# Patient Record
Sex: Female | Born: 1965 | Race: White | Hispanic: No | Marital: Married | Smoking: Former smoker
Health system: Southern US, Community
[De-identification: ages and names within clinical notes are randomized; demographics above are authoritative.]

## PROBLEM LIST (undated history)

## (undated) DIAGNOSIS — F419 Anxiety disorder, unspecified: Secondary | ICD-10-CM

## (undated) DIAGNOSIS — K56609 Unspecified intestinal obstruction, unspecified as to partial versus complete obstruction: Secondary | ICD-10-CM

## (undated) DIAGNOSIS — I10 Essential (primary) hypertension: Secondary | ICD-10-CM

## (undated) HISTORY — DX: Anxiety disorder, unspecified: F41.9

## (undated) HISTORY — DX: Essential (primary) hypertension: I10

---

## 1988-04-16 HISTORY — PX: BREAST REDUCTION SURGERY: SHX8

## 2017-07-30 ENCOUNTER — Encounter: Payer: Self-pay | Admitting: Cardiology

## 2017-08-12 ENCOUNTER — Encounter: Payer: Self-pay | Admitting: Cardiology

## 2017-08-20 ENCOUNTER — Encounter: Payer: Self-pay | Admitting: Cardiology

## 2017-08-20 NOTE — Progress Notes (Signed)
Cardiology Office Note  Date: 08/22/2017   ID: Claudia Brewer, DOB 04/24/1965, MRN [id number]  PCP: Ignatius Specking, MD  Primary Cardiologist: Nona Dell, MD   Chief Complaint  Patient presents with  . Chest Pain    History of Present Illness: Claudia Brewer is a 52 y.o. female referred for cardiology consultation by Ms. Hairfield NP for the evaluation of chest pain.  She states that she has been under a lot of emotional stress, has also gained 10 to 20 pounds over the last 6 months, not exercising as regularly.  She has had intermittent episodes of chest tightness, cannot specifically identify any particular activity that brings it on.  She is having trouble sleeping related to her stress as well.  Family history includes heart disease in her father as well as paternal grandfather.  She also has had elevated blood pressure and blood sugar recently, currently not on medical therapy.  I personally reviewed her ECG from 08/02/2017 which shows normal sinus rhythm with decreased R wave progression.  Recent echocardiogram done at [facility] in April is outlined below.  LVEF was vigorous at 70 to 75%.  There were no major valvular abnormalities to explain symptomatology.  Today we talked about additional testing for assessment of ischemic heart disease.  Past Medical History:  Diagnosis Date  . Anxiety   . Essential hypertension     Past Surgical History:  Procedure Laterality Date  . BREAST REDUCTION SURGERY  1990    Current Outpatient Medications  Medication Sig Dispense Refill  . ALPRAZolam (XANAX) 0.5 MG tablet Take 1 tablet by mouth at bedtime as needed.    Marland Kitchen buPROPion (WELLBUTRIN SR) 150 MG 12 hr tablet Take 150 mg by mouth 2 (two) times daily.     No current facility-administered medications for this visit.    Allergies:  Patient has no known allergies.   Social History: The patient  reports that she has quit smoking. Her smoking use included  cigarettes. She has never used smokeless tobacco. She reports that she drinks alcohol. She reports that she does not use drugs.   Family History: The patient's family history includes CAD in her father; Lung cancer in her father.   ROS:  Please see the history of present illness. Otherwise, complete review of systems is positive for none.  All other systems are reviewed and negative.   Physical Exam: VS:  BP (!) 151/81 (BP Location: Right Arm, Cuff Size: Large)   Pulse 81   Ht  (1.651 m)   Wt 208 lb (94.3 kg)   SpO2 97%   BMI 34.61 kg/m , BMI Body mass index is 34.61 kg/m.  Wt Readings from Last 3 Encounters:  08/22/17 208 lb (94.3 kg)    General: Patient appears comfortable at rest. HEENT: Conjunctiva and lids normal, oropharynx clear. Neck: Supple, no elevated JVP or carotid bruits, no thyromegaly. Lungs: Clear to auscultation, nonlabored breathing at rest. Cardiac: Regular rate and rhythm, no S3 or significant systolic murmur, no pericardial rub. Abdomen: Soft, nontender, bowel sounds present. Extremities: No pitting edema, distal pulses 2+. Skin: Warm and dry. Musculoskeletal: No kyphosis. Neuropsychiatric: Alert and oriented x3, affect grossly appropriate.  ECG: There is no old tracing available for comparison today.  Other Studies Reviewed Today:  Echocardiogram 08/12/2017 [facility]): Mild LVH with LVEF 70 to 75%, ungraded diastolic dysfunction, normal right ventricular contraction, moderate left atrial enlargement, mildly sclerotic aortic valve without stenosis, mildly calcified mitral leaflets with  trace mitral regurgitation, trace tricuspid regurgitation with RVSP estimated 16 mmHg, no pericardial effusion.  Assessment and Plan:  1.  Intermittent chest tightness, noted in the setting of emotional stress and weight gain over the last 6 months.  Cardiac risk factors include family history of CAD, recently elevated blood pressures and also blood sugars.   Baseline ECG shows sinus rhythm with poor R wave progression.  LVEF is vigorous at 70 to 75% by echocardiogram without wall motion abnormalities.  Plan is to obtain an exercise Myoview for objective evaluation of ischemic heart disease.  2.  Elevated blood pressure.  Currently not on antihypertensive medications.  We discussed weight loss, she needs to follow-up with PCP regarding possibility of medical therapy.  3.  Reported elevated blood sugars.  Discussed diet and weight loss.  Follow-up with PCP.  Current medicines were reviewed with the patient today.   Orders Placed This Encounter  Procedures  . NM Myocar Multi W/Spect W/Wall Motion / EF    Disposition: Call with test results.  Signed, Jonelle Sidle, MD, [facility] 08/22/2017 1:46 PM    [facility] at [facility] [address] [place], [place], [place] [address] Phone: [phone]; Fax: [phone]

## 2017-08-22 ENCOUNTER — Ambulatory Visit: Payer: BC Managed Care – PPO | Admitting: Cardiology

## 2017-08-22 ENCOUNTER — Encounter: Payer: Self-pay | Admitting: Cardiology

## 2017-08-22 ENCOUNTER — Telehealth: Payer: Self-pay | Admitting: Cardiology

## 2017-08-22 VITALS — BP 151/81 | HR 81 | Ht 65.0 in | Wt 208.0 lb

## 2017-08-22 DIAGNOSIS — R457 State of emotional shock and stress, unspecified: Secondary | ICD-10-CM | POA: Diagnosis not present

## 2017-08-22 DIAGNOSIS — Z8249 Family history of ischemic heart disease and other diseases of the circulatory system: Secondary | ICD-10-CM | POA: Diagnosis not present

## 2017-08-22 DIAGNOSIS — R7309 Other abnormal glucose: Secondary | ICD-10-CM | POA: Diagnosis not present

## 2017-08-22 DIAGNOSIS — R030 Elevated blood-pressure reading, without diagnosis of hypertension: Secondary | ICD-10-CM

## 2017-08-22 DIAGNOSIS — R9431 Abnormal electrocardiogram [ECG] [EKG]: Secondary | ICD-10-CM | POA: Diagnosis not present

## 2017-08-22 DIAGNOSIS — R0789 Other chest pain: Secondary | ICD-10-CM | POA: Diagnosis not present

## 2017-08-22 DIAGNOSIS — R072 Precordial pain: Secondary | ICD-10-CM

## 2017-08-22 NOTE — Patient Instructions (Signed)
Medication Instructions:  Your physician recommends that you continue on your current medications as directed. Please refer to the Current Medication list given to you today.  Labwork: NONE  Testing/Procedures: Your physician has requested that you have en exercise stress myoview. For further information please visit [email or web address]. Please follow instruction sheet, as given.  Follow-Up: Your physician recommends that you schedule a follow-up appointment PENDING TEST RESULTS  Any Other Special Instructions Will Be Listed Below (If Applicable).  If you need a refill on your cardiac medications before your next appointment, please call your pharmacy. 

## 2017-08-22 NOTE — Telephone Encounter (Signed)
Pre-cert Verification for the following procedure   Exercise myoview scheduled for 08/29/17 at [facility]

## 2017-08-29 ENCOUNTER — Encounter (HOSPITAL_COMMUNITY)
Admission: RE | Admit: 2017-08-29 | Discharge: 2017-08-29 | Disposition: A | Discharge status: Home / Self Care | Payer: BC Managed Care – PPO | Source: Ambulatory Visit | Attending: Cardiology | Admitting: Cardiology

## 2017-08-29 ENCOUNTER — Ambulatory Visit (HOSPITAL_COMMUNITY)
Admission: RE | Admit: 2017-08-29 | Discharge: 2017-08-29 | Disposition: A | Discharge status: Home / Self Care | Payer: BC Managed Care – PPO | Source: Ambulatory Visit | Attending: Cardiology | Admitting: Cardiology

## 2017-08-29 ENCOUNTER — Encounter (HOSPITAL_COMMUNITY): Payer: Self-pay

## 2017-08-29 DIAGNOSIS — R0789 Other chest pain: Secondary | ICD-10-CM

## 2017-08-29 DIAGNOSIS — R9431 Abnormal electrocardiogram [ECG] [EKG]: Secondary | ICD-10-CM

## 2017-08-29 LAB — NM MYOCAR MULTI W/SPECT W/WALL MOTION / EF
CHL CUP NUCLEAR SSS: 0
CSEPED: 8 min
Estimated workload: 10.1 METS
Exercise duration (sec): 20 s
LV sys vol: 22 mL
LVDIAVOL: 59 mL (ref 46–106)
MPHR: 169 {beats}/min
NUC STRESS TID: 0.78
Peak HR: 153 {beats}/min
Percent HR: 90 %
RATE: 0.46
RPE: 17
Rest HR: 71 {beats}/min
SDS: 0
SRS: 0

## 2017-08-29 MED ORDER — TECHNETIUM TC 99M TETROFOSMIN IV KIT
10.0000 | PACK | Freq: Once | INTRAVENOUS | Status: AC | PRN
  Administered 2017-08-29: 9.1 via INTRAVENOUS

## 2017-08-29 MED ORDER — SODIUM CHLORIDE 0.9% FLUSH
INTRAVENOUS | Status: AC
  Filled 2017-08-29: qty 180

## 2017-08-29 MED ORDER — SODIUM CHLORIDE 0.9% FLUSH
INTRAVENOUS | Status: AC
  Administered 2017-08-29: 10 mL via INTRAVENOUS
  Filled 2017-08-29: qty 10

## 2017-08-29 MED ORDER — REGADENOSON 0.4 MG/5ML IV SOLN
INTRAVENOUS | Status: AC
  Filled 2017-08-29: qty 5

## 2017-08-29 MED ORDER — TECHNETIUM TC 99M TETROFOSMIN IV KIT
30.0000 | PACK | Freq: Once | INTRAVENOUS | Status: AC | PRN
  Administered 2017-08-29: 30 via INTRAVENOUS

## 2017-09-02 ENCOUNTER — Telehealth: Payer: Self-pay | Admitting: *Deleted

## 2017-09-02 NOTE — Telephone Encounter (Signed)
-----   Message from Jonelle Sidle, MD sent at 09/01/2017  2:21 PM EDT ----- Results reviewed. Please let her know that the stress test was overall low risk from a cardiac perspective (no ischemic defects to suggest obstructive CAD). Would focus on weight loss, stress reduction, and follow-up on blood pressure with PCP. No further cardiac testing is planned at this time. A copy of this test should be forwarded to Ignatius Specking, MD.

## 2017-09-02 NOTE — Telephone Encounter (Signed)
Patient informed and copy sent to PCP. 

## 2018-11-05 ENCOUNTER — Encounter (INDEPENDENT_AMBULATORY_CARE_PROVIDER_SITE_OTHER): Payer: Self-pay | Admitting: *Deleted

## 2019-06-25 ENCOUNTER — Encounter (INDEPENDENT_AMBULATORY_CARE_PROVIDER_SITE_OTHER): Payer: Self-pay | Admitting: Gastroenterology

## 2019-07-11 ENCOUNTER — Emergency Department (HOSPITAL_COMMUNITY): Payer: BC Managed Care – PPO

## 2019-07-11 ENCOUNTER — Encounter (HOSPITAL_COMMUNITY): Payer: Self-pay

## 2019-07-11 ENCOUNTER — Emergency Department (HOSPITAL_COMMUNITY)
Admission: EM | Admit: 2019-07-11 | Discharge: 2019-07-11 | Disposition: A | Discharge status: Home / Self Care | Payer: BC Managed Care – PPO | Attending: Emergency Medicine | Admitting: Emergency Medicine

## 2019-07-11 ENCOUNTER — Other Ambulatory Visit: Payer: Self-pay

## 2019-07-11 DIAGNOSIS — I10 Essential (primary) hypertension: Secondary | ICD-10-CM | POA: Diagnosis not present

## 2019-07-11 DIAGNOSIS — N200 Calculus of kidney: Secondary | ICD-10-CM | POA: Diagnosis not present

## 2019-07-11 DIAGNOSIS — Z87891 Personal history of nicotine dependence: Secondary | ICD-10-CM | POA: Diagnosis not present

## 2019-07-11 DIAGNOSIS — R109 Unspecified abdominal pain: Secondary | ICD-10-CM | POA: Diagnosis present

## 2019-07-11 DIAGNOSIS — Z79899 Other long term (current) drug therapy: Secondary | ICD-10-CM | POA: Insufficient documentation

## 2019-07-11 DIAGNOSIS — E119 Type 2 diabetes mellitus without complications: Secondary | ICD-10-CM | POA: Diagnosis not present

## 2019-07-11 DIAGNOSIS — Z7984 Long term (current) use of oral hypoglycemic drugs: Secondary | ICD-10-CM | POA: Diagnosis not present

## 2019-07-11 DIAGNOSIS — K5904 Chronic idiopathic constipation: Secondary | ICD-10-CM | POA: Diagnosis not present

## 2019-07-11 HISTORY — DX: Unspecified intestinal obstruction, unspecified as to partial versus complete obstruction: K56.609

## 2019-07-11 LAB — COMPREHENSIVE METABOLIC PANEL
ALT: 20 U/L (ref 0–44)
AST: 25 U/L (ref 15–41)
Albumin: 4.3 g/dL (ref 3.5–5.0)
Alkaline Phosphatase: 60 U/L (ref 38–126)
Anion gap: 9 (ref 5–15)
BUN: 16 mg/dL (ref 6–20)
CO2: 27 mmol/L (ref 22–32)
Calcium: 9.2 mg/dL (ref 8.9–10.3)
Chloride: 100 mmol/L (ref 98–111)
Creatinine, Ser: 1.48 mg/dL — ABNORMAL HIGH (ref 0.44–1.00)
GFR calc Af Amer: 46 mL/min — ABNORMAL LOW (ref >60)
GFR calc non Af Amer: 40 mL/min — ABNORMAL LOW (ref >60)
Glucose, Bld: 192 mg/dL — ABNORMAL HIGH (ref 70–99)
Potassium: 4.1 mmol/L (ref 3.5–5.1)
Sodium: 136 mmol/L (ref 135–145)
Total Bilirubin: 0.8 mg/dL (ref 0.3–1.2)
Total Protein: 7.3 g/dL (ref 6.5–8.1)

## 2019-07-11 LAB — POC URINE PREG, ED: Preg Test, Ur: NEGATIVE

## 2019-07-11 LAB — CBC
HCT: 44.4 % (ref 36.0–46.0)
Hemoglobin: 14.4 g/dL (ref 12.0–15.0)
MCH: 28.5 pg (ref 26.0–34.0)
MCHC: 32.4 g/dL (ref 30.0–36.0)
MCV: 87.7 fL (ref 80.0–100.0)
Platelets: 288 10*3/uL (ref 150–400)
RBC: 5.06 MIL/uL (ref 3.87–5.11)
RDW: 12.1 % (ref 11.5–15.5)
WBC: 9.8 10*3/uL (ref 4.0–10.5)
nRBC: 0 % (ref 0.0–0.2)

## 2019-07-11 LAB — LIPASE, BLOOD: Lipase: 24 U/L (ref 11–51)

## 2019-07-11 MED ORDER — TAMSULOSIN HCL 0.4 MG PO CAPS: 0.4000 mg | ORAL_CAPSULE | Freq: Every day | ORAL | 0 refills | Status: AC

## 2019-07-11 MED ORDER — IOHEXOL 300 MG/ML  SOLN
80.0000 mL | Freq: Once | INTRAMUSCULAR | Status: AC | PRN
  Administered 2019-07-11: 80 mL via INTRAVENOUS

## 2019-07-11 MED ORDER — KETOROLAC TROMETHAMINE 60 MG/2ML IM SOLN
60.0000 mg | Freq: Once | INTRAMUSCULAR | Status: AC
  Administered 2019-07-11: 60 mg via INTRAMUSCULAR
  Filled 2019-07-11: qty 2

## 2019-07-11 MED ORDER — HYDROCODONE-ACETAMINOPHEN 5-325 MG PO TABS: 1.0000 | ORAL_TABLET | Freq: Four times a day (QID) | ORAL | 0 refills | Status: DC | PRN

## 2019-07-11 NOTE — ED Triage Notes (Signed)
Pt c/o r lower abd pain since yesterday and vomiting this morning.  Reports no BM in 4 days.  Pt says had a bowel obstruction 2 months ago.

## 2019-07-11 NOTE — ED Provider Notes (Signed)
[facility] EMERGENCY DEPARTMENT Provider Note   CSN: [id number] Arrival date & time: 07/11/19  1103     History Chief Complaint  Patient presents with  . Abdominal Pain    Claudia Brewer is a 54 y.o. female.  HPI 54 year old well-appearing female with a history of DM type II on Metformin and bowel obstruction presents to the ER with right lower abdominal pain x4 days.  Last bowel movement was approximately 4 days ago.  States she had a similar episode approximately 2 months ago, she went to the ER at [facility] where they did an x-ray and was told she was constipated.  She was discharged and was told to take over-the-counter stool softeners.  Reports taking MiraLAX, magnesium citrate, and bowels began moving approximately 2 to 3 days after but bowel movements have remained slow. She reports she has been having increased diffuse right-sided abdominal x4 days, the pain has gotten so severe that she took a Tramadol this morning which has mildly alleviated her pain.  She had one episode of nonbilious nonbloody vomiting this morning.  She has tried taking MiraLAX without relief.  Denies dizziness, chest pain, fevers, dysuria, hematuria, flank pain, syncope, hematochezia.     Past Medical History:  Diagnosis Date  . Anxiety   . Bowel obstruction (HCC)   . Essential hypertension     There are no problems to display for this patient.   Past Surgical History:  Procedure Laterality Date  . BREAST REDUCTION SURGERY  1990     OB History   No obstetric history on file.     Family History  Problem Relation Age of Onset  . CAD Father   . Lung cancer Father     Social History   Tobacco Use  . Smoking status: Former Smoker    Types: Cigarettes  . Smokeless tobacco: Never Used  Substance Use Topics  . Alcohol use: Not Currently    Comment: Occasional  . Drug use: Never    Home Medications Prior to Admission medications   Medication Sig Start Date End Date Taking?  Authorizing Provider  ALPRAZolam Prudy Feeler) 0.5 MG tablet Take 1 tablet by mouth at bedtime as needed. 07/30/17   [provider]  amLODipine (NORVASC) 5 MG tablet Take 5 mg by mouth daily. 06/10/19   [provider]  buPROPion (WELLBUTRIN SR) 150 MG 12 hr tablet Take 150 mg by mouth 2 (two) times daily.    [provider]  dicyclomine (BENTYL) 10 MG capsule Take 10 mg by mouth 2 (two) times daily as needed. 05/04/19   [provider]  hydrochlorothiazide (HYDRODIURIL) 12.5 MG tablet Take 12.5 mg by mouth daily. 04/06/19   [provider]  HYDROcodone-acetaminophen (NORCO/VICODIN) 5-325 MG tablet Take 1 tablet by mouth every 6 (six) hours as needed. 07/11/19   Mare Ferrari, PA-C  metFORMIN (GLUCOPHAGE) 500 MG tablet Take 500 mg by mouth 3 (three) times daily. 05/20/19   [provider]  rosuvastatin (CRESTOR) 20 MG tablet Take 20 mg by mouth at bedtime. 06/10/19   [provider]  tamsulosin (FLOMAX) 0.4 MG CAPS capsule Take 1 capsule (0.4 mg total) by mouth daily for 5 days. 07/11/19 07/16/19  Mare Ferrari, PA-C  traMADol (ULTRAM) 50 MG tablet Take 50 mg by mouth 3 (three) times daily as needed. 05/04/19   [provider]    Allergies    Patient has no known allergies.  Review of Systems   Review of Systems  Constitutional: Negative for appetite change, chills and fever.  Respiratory: Negative for shortness of breath.   Gastrointestinal: Positive for abdominal pain, constipation, nausea and vomiting. Negative for diarrhea and rectal pain.  Endocrine: Negative for polydipsia, polyphagia and polyuria.  Genitourinary: Negative for decreased urine volume, difficulty urinating, dysuria, flank pain, frequency, hematuria, pelvic pain and urgency.  Musculoskeletal: Negative for back pain and myalgias.  Skin: Negative for color change and rash.  Neurological: Negative for dizziness and headaches.    Physical Exam Updated Vital  Signs BP (!) 144/74   Pulse 82   Temp 98.2 F (36.8 C) (Oral)   Resp 18   Ht 5\' 5"  (1.651 m)   Wt 83 kg   SpO2 95%   BMI 30.45 kg/m   Physical Exam Vitals and nursing note reviewed.  Constitutional:      Appearance: Normal appearance.  HENT:     Head: Normocephalic and atraumatic.  Eyes:     Extraocular Movements: Extraocular movements intact.     Conjunctiva/sclera: Conjunctivae normal.     Pupils: Pupils are equal, round, and reactive to light.  Cardiovascular:     Rate and Rhythm: Normal rate and regular rhythm.     Pulses: Normal pulses.     Heart sounds: Normal heart sounds.  Pulmonary:     Effort: Pulmonary effort is normal.     Breath sounds: Normal breath sounds.  Abdominal:     General: Abdomen is flat. Bowel sounds are normal. There are no signs of injury.     Palpations: Abdomen is soft. There is no mass.     Tenderness: There is abdominal tenderness in the right upper quadrant and right lower quadrant. There is no right CVA tenderness, left CVA tenderness, guarding or rebound. Negative signs include Murphy's sign, McBurney's sign and obturator sign.     Hernia: No hernia is present.  Musculoskeletal:        General: Normal range of motion.     Cervical back: Normal range of motion. No tenderness.  Skin:    General: Skin is warm and dry.  Neurological:     Mental Status: She is alert and oriented to person, place, and time.  Psychiatric:        Mood and Affect: Mood normal.        Behavior: Behavior normal.     ED Results / Procedures / Treatments   Labs (all labs ordered are listed, but only abnormal results are displayed) Labs Reviewed  COMPREHENSIVE METABOLIC PANEL - Abnormal; Notable for the following components:      Result Value   Glucose, Bld 192 (*)    Creatinine, Ser 1.48 (*)    GFR calc non Af Amer 40 (*)    GFR calc Af Amer 46 (*)    All other components within normal limits  LIPASE, BLOOD  CBC  URINALYSIS, ROUTINE W REFLEX MICROSCOPIC   POC URINE PREG, ED    EKG None  Radiology CT Abdomen Pelvis Wo Contrast  Result Date: 07/11/2019 CLINICAL DATA:  Right lower abdominal pain x1 day, vomiting, history of bowel obstruction EXAM: CT ABDOMEN AND PELVIS WITH CONTRAST TECHNIQUE: Multidetector CT imaging of the abdomen and pelvis was performed using the standard protocol following bolus administration of intravenous contrast. CONTRAST:  66mL OMNIPAQUE IOHEXOL 300 MG/ML  SOLN COMPARISON:  03/03/2018 and 06/29/2014 by report only FINDINGS: Lower chest: No acute abnormality. Hepatobiliary: No focal liver abnormality is seen. No gallstones, gallbladder wall thickening, or biliary dilatation. Pancreas: Unremarkable.  No pancreatic ductal dilatation or surrounding inflammatory changes. Spleen: Normal in size without focal abnormality. Adrenals/Urinary Tract: 2.1 cm left adrenal low-attenuation nodule, described on prior study of 06/29/2014. Right adrenal unremarkable. Moderate right hydronephrosis and ureterectasis down to the level of a 6 mm distal ureteral calculus, several cm from the UVJ. There is slightly decreased right renal enhancement compared to the left. The left kidney is unremarkable. The urinary bladder is incompletely distended. Stomach/Bowel: Stomach decompressed. Small bowel is nondistended. Normal appendix. The colon is nondilated, unremarkable. Vascular/Lymphatic: Minimal aortoiliac atherosclerosis (ICD10-170.0). No abdominal or pelvic adenopathy. Reproductive: IUD in place. No adnexal mass. Other: Bilateral pelvic phleboliths. No ascites. No free air. Musculoskeletal: Mild lower thoracic and lumbar spondylitic changes. No fracture or worrisome bone lesion. IMPRESSION: 1. Obstructing 6 mm distal right ureteral calculus with moderate hydronephrosis. 2. Stable left adrenal adenoma. Aortic Atherosclerosis (ICD10-I70.0). Electronically Signed   By: Corlis Leak M.D.   On: 07/11/2019 13:53   CT ABDOMEN PELVIS W CONTRAST  Result Date:  07/11/2019 CLINICAL DATA:  Right lower abdominal pain x1 day, vomiting, history of bowel obstruction EXAM: CT ABDOMEN AND PELVIS WITH CONTRAST TECHNIQUE: Multidetector CT imaging of the abdomen and pelvis was performed using the standard protocol following bolus administration of intravenous contrast. CONTRAST:  74mL OMNIPAQUE IOHEXOL 300 MG/ML  SOLN COMPARISON:  03/03/2018 and 06/29/2014 by report only FINDINGS: Lower chest: No acute abnormality. Hepatobiliary: No focal liver abnormality is seen. No gallstones, gallbladder wall thickening, or biliary dilatation. Pancreas: Unremarkable. No pancreatic ductal dilatation or surrounding inflammatory changes. Spleen: Normal in size without focal abnormality. Adrenals/Urinary Tract: 2.1 cm left adrenal low-attenuation nodule, described on prior study of 06/29/2014. Right adrenal unremarkable. Moderate right hydronephrosis and ureterectasis down to the level of a 6 mm distal ureteral calculus, several cm from the UVJ. There is slightly decreased right renal enhancement compared to the left. The left kidney is unremarkable. The urinary bladder is incompletely distended. Stomach/Bowel: Stomach decompressed. Small bowel is nondistended. Normal appendix. The colon is nondilated, unremarkable. Vascular/Lymphatic: Minimal aortoiliac atherosclerosis (ICD10-170.0). No abdominal or pelvic adenopathy. Reproductive: IUD in place. No adnexal mass. Other: Bilateral pelvic phleboliths. No ascites. No free air. Musculoskeletal: Mild lower thoracic and lumbar spondylitic changes. No fracture or worrisome bone lesion. IMPRESSION: 1. Obstructing 6 mm distal right ureteral calculus with moderate hydronephrosis. 2. Stable left adrenal adenoma. Aortic Atherosclerosis (ICD10-I70.0). Electronically Signed   By: Corlis Leak M.D.   On: 07/11/2019 13:53    Procedures Procedures (including critical care time)  Medications Ordered in ED Medications  iohexol (OMNIPAQUE) 300 MG/ML solution 80 mL  (80 mLs Intravenous Contrast Given 07/11/19 1321)  ketorolac (TORADOL) injection 60 mg (60 mg Intramuscular Given 07/11/19 1411)    ED Course  I have reviewed the triage vital signs and the nursing notes.  Pertinent labs & imaging results that were available during my care of the patient were reviewed by me and considered in my medical decision making (see chart for details).    MDM Rules/Calculators/A&P                     54 year old female presents ER with right lower abdominal pain. Patient is hypertensive but other vitals nonconcerning.  She is well-appearing, no acute distress.  On physical exam she has diffuse right-sided abdominal pain, negative McBurney's, McMurphy's, no guarding, rebound or CVA tenderness. Basic labs and CT to rule out any acute pathology.  CBC without leukocytosis, glucose mildly elevated and creatinine at 1.48 with no  previous value to compare to.  Likely secondary to 6 mm obstructing distal right ureteral calculus found on CT scan.  UA pending, results are not likely to change clinical course.  Lipase normal.  CT negative for SBO; a stable left adrenal adenoma was noted, and this was mentioned to the patient. Patient received Toradol injection and notes significant improvement in her pain.  Discussed findings with the patient, urged outpatient follow-up with urologist along with Flomax and Norco for symptomatic pain management.  Encouraged continuing to use MiraLAX, increasing dose as needed for stimulation of bowels and continuing to drink plenty of fluids.  Return precautions given.  The patient voices understanding and is agreeable with this plan.  At this point in ED course patient is stable for discharge.  Patient was seen and evaluated by Dr. Hyacinth Meeker who agrees with the above plan.   final Clinical Impression(s) / ED Diagnoses Final diagnoses:  Right kidney stone  Chronic idiopathic constipation    Rx / DC Orders ED Discharge Orders         Ordered     tamsulosin (FLOMAX) 0.4 MG CAPS capsule  Daily     07/11/19 1420    HYDROcodone-acetaminophen (NORCO/VICODIN) 5-325 MG tablet  Every 6 hours PRN     07/11/19 1420           Leone Brand 07/11/19 1438    Eber Hong, MD 07/12/19 [phone]

## 2019-07-11 NOTE — ED Provider Notes (Signed)
Medical screening examination/treatment/procedure(s) were conducted as a shared visit with non-physician practitioner(s) and myself.  I personally evaluated the patient during the encounter.  Clinical Impression:   Final diagnoses:  Right kidney stone  Chronic idiopathic constipation    This patient is a overweight 54 year old female presenting with abdominal discomfort mostly in the right lower abdomen.  She was worked up a couple of months ago at an outside hospital where she had plain films, she was placed on stool softeners and laxatives, she was able to have bowel movements and successfully was able to empty her bowels but has continued to be rather constipated over time.  In fact this is a relative chronic issue for this patient who only has 2 or 3 bowel movements a week for the majority of her life.  That being said 4 days ago she started to have increasing abdominal discomfort but this became acutely worsened last night and this morning when she actually took tramadol for the pain.  She does not take any other opiate medications.  She has not had a bowel movement in 4 days.  On exam the patient has a soft abdomen with minimal tenderness on the right, she has clear heart and lung sounds, no CVA tenderness, no edema.  She will need a CT scan to further rule out any other pathology though this may just be slow transit constipation.  Patient agreeable to the work-up.   Eber Hong, MD 07/12/19 [phone]

## 2019-07-11 NOTE — Discharge Instructions (Signed)
You were seen in the ER for right-sided abdominal pain.  Your CT scan did not show a bowel obstruction but did show a right 6 mm stone that appears to be moving. We will send a medication called Flomax which you need to take once a day for 5 days which will increase your urine to help pass the stone and a pain medication called Norco for pain management.  Continue to take MiraLAX, increasing your dose until you start having a bowel movement. Take additional magnesium citrate if needed. Decrease dosing once your bowels start moving.  Make sure to make a follow-up appointment with the urology physician provided above or your primary care provider as needed.  Return to the ER if your symptoms get worse.

## 2019-10-07 ENCOUNTER — Ambulatory Visit (INDEPENDENT_AMBULATORY_CARE_PROVIDER_SITE_OTHER): Payer: BC Managed Care – PPO | Admitting: Gastroenterology

## 2019-12-07 ENCOUNTER — Ambulatory Visit (INDEPENDENT_AMBULATORY_CARE_PROVIDER_SITE_OTHER): Payer: BC Managed Care – PPO | Admitting: Gastroenterology

## 2021-03-22 ENCOUNTER — Other Ambulatory Visit: Payer: Self-pay | Admitting: Internal Medicine

## 2021-03-22 DIAGNOSIS — Z139 Encounter for screening, unspecified: Secondary | ICD-10-CM

## 2021-03-27 ENCOUNTER — Other Ambulatory Visit: Payer: Self-pay

## 2021-03-27 ENCOUNTER — Ambulatory Visit
Admission: RE | Admit: 2021-03-27 | Discharge: 2021-03-27 | Disposition: A | Discharge status: Home / Self Care | Payer: BC Managed Care – PPO | Source: Ambulatory Visit | Attending: Internal Medicine | Admitting: Internal Medicine

## 2021-03-27 DIAGNOSIS — Z139 Encounter for screening, unspecified: Secondary | ICD-10-CM

## 2022-06-07 ENCOUNTER — Encounter: Payer: Self-pay | Admitting: *Deleted

## 2022-06-27 ENCOUNTER — Other Ambulatory Visit: Payer: Self-pay | Admitting: Internal Medicine

## 2022-06-27 DIAGNOSIS — Z1231 Encounter for screening mammogram for malignant neoplasm of breast: Secondary | ICD-10-CM

## 2022-06-28 ENCOUNTER — Telehealth (INDEPENDENT_AMBULATORY_CARE_PROVIDER_SITE_OTHER): Payer: Self-pay | Admitting: *Deleted

## 2022-06-28 NOTE — Telephone Encounter (Signed)
  Procedure: colonoscopy  Height: 5'5 Weight: 170lbs        Have you had a colonoscopy before?  no  Do you have family history of colon cancer?  no  Do you have a family history of polyps? no  Previous colonoscopy with polyps removed? no  Do you have a history colorectal cancer?   no  Are you diabetic?  Yes, type 2  Do you have a prosthetic or mechanical heart valve? no  Do you have a pacemaker/defibrillator?   no  Have you had endocarditis/atrial fibrillation?  no  Do you use supplemental oxygen/CPAP?  no  Have you had joint replacement within the last 12 months?  no  Do you tend to be constipated or have to use laxatives?  yes   Do you have history of alcohol use? If yes, how much and how often.  Once a month  Do you have history or are you using drugs? If yes, what do are you  using?  no  Have you ever had a stroke/heart attack?  no  Have you ever had a heart or other vascular stent placed,?no  Do you take weight loss medication? no  female patients,: have you had a hysterectomy? no                              are you post menopausal?  Don't know                              do you still have your menstrual cycle? no    Date of last menstrual period? No clue  Do you take any blood-thinning medications such as: (Plavix, aspirin, Coumadin, Aggrenox, Brilinta, Xarelto, Eliquis, Pradaxa, Savaysa or Effient)? no  If yes we need the name, milligram, dosage and who is prescribing doctor:               Current Outpatient Medications  Medication Sig Dispense Refill   amLODipine (NORVASC) 5 MG tablet Take 5 mg by mouth daily.     buPROPion (WELLBUTRIN SR) 150 MG 12 hr tablet Take 150 mg by mouth 2 (two) times daily.     hydrochlorothiazide (HYDRODIURIL) 12.5 MG tablet Take 12.5 mg by mouth daily.     linaclotide (LINZESS) 290 MCG CAPS capsule Take 290 mcg by mouth daily before breakfast.     metFORMIN (GLUCOPHAGE) 500 MG tablet Take 500 mg by mouth 2 (two) times daily  with a meal.     OZEMPIC, 2 MG/DOSE, 8 MG/3ML SOPN Inject into the skin. 1.25mg  once a week     rosuvastatin (CRESTOR) 5 MG tablet Take 5 mg by mouth at bedtime.     No current facility-administered medications for this visit.    No Known Allergies

## 2022-07-04 NOTE — Telephone Encounter (Signed)
ASA 2.   BMP pre op Hold metformin night prior to and morning of procedure Hold Ozempic for 1 week.  Extra 1/2 day of clears.  Continue Linzess 290 mcg daily.

## 2022-07-05 NOTE — Telephone Encounter (Signed)
Called pt. She wants to schedule after 6/12 when they get out for the summer. Advised will call once I get that schedule. FYI ann

## 2022-07-10 ENCOUNTER — Ambulatory Visit: Payer: BC Managed Care – PPO

## 2022-08-07 NOTE — Telephone Encounter (Signed)
Lmovm TO CALL BACK TO SCHEDULE

## 2022-08-08 ENCOUNTER — Telehealth (INDEPENDENT_AMBULATORY_CARE_PROVIDER_SITE_OTHER): Payer: Self-pay | Admitting: Gastroenterology

## 2022-08-08 NOTE — Telephone Encounter (Signed)
Patient left voice mail stated calling back to schedule her colonoscopy - ph# [phone]

## 2022-08-08 NOTE — Telephone Encounter (Signed)
LMOVM to call back 

## 2022-08-14 ENCOUNTER — Encounter: Payer: Self-pay | Admitting: *Deleted

## 2022-08-24 ENCOUNTER — Ambulatory Visit: Payer: BC Managed Care – PPO

## 2022-10-08 ENCOUNTER — Other Ambulatory Visit: Payer: Self-pay | Admitting: *Deleted

## 2022-10-08 ENCOUNTER — Encounter: Payer: Self-pay | Admitting: *Deleted

## 2022-10-08 ENCOUNTER — Telehealth: Payer: Self-pay | Admitting: Internal Medicine

## 2022-10-08 DIAGNOSIS — Z1211 Encounter for screening for malignant neoplasm of colon: Secondary | ICD-10-CM

## 2022-10-08 MED ORDER — PEG 3350-KCL-NA BICARB-NACL 420 G PO SOLR: 4000.0000 mL | Freq: Once | ORAL | 0 refills | Status: DC

## 2022-10-08 NOTE — Telephone Encounter (Signed)
Pt has been scheduled for 11/06/22 with Dr.Carver. Instructions mailed and prep sent to pharmacy. 

## 2022-10-08 NOTE — Telephone Encounter (Signed)
I saw notes from April about patient calling back to schedule colonoscopy.  She left a message over the weekend on voice mail she is ready to schedule.

## 2022-10-08 NOTE — Telephone Encounter (Signed)
Pt has been scheduled for 11/06/22 with Dr.Carver. Instructions mailed and prep sent to pharmacy.

## 2022-10-08 NOTE — Addendum Note (Signed)
Addended by: Elinor Dodge on: 10/08/2022 03:14 PM   Modules accepted: Orders

## 2022-10-10 ENCOUNTER — Encounter: Payer: Self-pay | Admitting: *Deleted

## 2022-10-10 NOTE — Telephone Encounter (Signed)
Referral completed

## 2022-11-01 ENCOUNTER — Other Ambulatory Visit: Payer: Self-pay | Admitting: *Deleted

## 2022-11-01 MED ORDER — PEG 3350-KCL-NA BICARB-NACL 420 G PO SOLR: 4000.0000 mL | Freq: Once | ORAL | 0 refills | Status: AC

## 2022-11-02 ENCOUNTER — Other Ambulatory Visit (HOSPITAL_COMMUNITY)
Admission: RE | Admit: 2022-11-02 | Discharge: 2022-11-02 | Disposition: A | Discharge status: Home / Self Care | Payer: BC Managed Care – PPO | Source: Ambulatory Visit | Attending: Internal Medicine | Admitting: Internal Medicine

## 2022-11-02 DIAGNOSIS — Z1211 Encounter for screening for malignant neoplasm of colon: Secondary | ICD-10-CM | POA: Diagnosis not present

## 2022-11-02 LAB — BASIC METABOLIC PANEL
Anion gap: 7 (ref 5–15)
BUN: 11 mg/dL (ref 6–20)
CO2: 29 mmol/L (ref 22–32)
Calcium: 9.2 mg/dL (ref 8.9–10.3)
Chloride: 102 mmol/L (ref 98–111)
Creatinine, Ser: 1.02 mg/dL — ABNORMAL HIGH (ref 0.44–1.00)
GFR, Estimated: >60 mL/min (ref >60)
Glucose, Bld: 133 mg/dL — ABNORMAL HIGH (ref 70–99)
Potassium: 3.7 mmol/L (ref 3.5–5.1)
Sodium: 138 mmol/L (ref 135–145)

## 2022-11-06 ENCOUNTER — Ambulatory Visit (HOSPITAL_COMMUNITY): Payer: BC Managed Care – PPO | Admitting: Anesthesiology

## 2022-11-06 ENCOUNTER — Ambulatory Visit (HOSPITAL_COMMUNITY)
Admission: RE | Admit: 2022-11-06 | Discharge: 2022-11-06 | Disposition: A | Discharge status: Home / Self Care | Payer: BC Managed Care – PPO | Attending: Internal Medicine | Admitting: Internal Medicine

## 2022-11-06 ENCOUNTER — Other Ambulatory Visit: Payer: Self-pay

## 2022-11-06 ENCOUNTER — Encounter (HOSPITAL_COMMUNITY): Payer: Self-pay

## 2022-11-06 ENCOUNTER — Encounter (HOSPITAL_COMMUNITY)
Admission: RE | Disposition: A | Discharge status: Home / Self Care | Payer: Self-pay | Source: Home / Self Care | Attending: Internal Medicine

## 2022-11-06 DIAGNOSIS — Z87891 Personal history of nicotine dependence: Secondary | ICD-10-CM | POA: Diagnosis not present

## 2022-11-06 DIAGNOSIS — D122 Benign neoplasm of ascending colon: Secondary | ICD-10-CM | POA: Insufficient documentation

## 2022-11-06 DIAGNOSIS — D125 Benign neoplasm of sigmoid colon: Secondary | ICD-10-CM | POA: Insufficient documentation

## 2022-11-06 DIAGNOSIS — F419 Anxiety disorder, unspecified: Secondary | ICD-10-CM | POA: Insufficient documentation

## 2022-11-06 DIAGNOSIS — D123 Benign neoplasm of transverse colon: Secondary | ICD-10-CM | POA: Diagnosis not present

## 2022-11-06 DIAGNOSIS — K648 Other hemorrhoids: Secondary | ICD-10-CM | POA: Insufficient documentation

## 2022-11-06 DIAGNOSIS — I10 Essential (primary) hypertension: Secondary | ICD-10-CM | POA: Insufficient documentation

## 2022-11-06 DIAGNOSIS — Z1211 Encounter for screening for malignant neoplasm of colon: Secondary | ICD-10-CM | POA: Insufficient documentation

## 2022-11-06 HISTORY — PX: POLYPECTOMY: SHX5525

## 2022-11-06 HISTORY — PX: COLONOSCOPY WITH PROPOFOL: SHX5780

## 2022-11-06 LAB — GLUCOSE, CAPILLARY: Glucose-Capillary: 136 mg/dL — ABNORMAL HIGH (ref 70–99)

## 2022-11-06 SURGERY — COLONOSCOPY WITH PROPOFOL
Anesthesia: General

## 2022-11-06 MED ORDER — LACTATED RINGERS IV SOLN: INTRAVENOUS | Status: DC

## 2022-11-06 MED ORDER — PROPOFOL 10 MG/ML IV BOLUS
INTRAVENOUS | Status: DC | PRN
Start: 2022-11-06 — End: 2022-11-06
  Administered 2022-11-06 (×2): 50 mg via INTRAVENOUS
  Administered 2022-11-06: 100 mg via INTRAVENOUS
  Administered 2022-11-06 (×3): 50 mg via INTRAVENOUS

## 2022-11-06 MED ORDER — LIDOCAINE HCL (CARDIAC) PF 100 MG/5ML IV SOSY
PREFILLED_SYRINGE | INTRAVENOUS | Status: DC | PRN
  Administered 2022-11-06: 50 mg via INTRAVENOUS

## 2022-11-06 NOTE — Anesthesia Preprocedure Evaluation (Signed)
Anesthesia Evaluation  Patient identified by MRN, date of birth, ID band Patient awake    Reviewed: Allergy & Precautions, H&P , NPO status , Patient's Chart, lab work & pertinent test results, reviewed documented beta blocker date and time   Airway Mallampati: II  TM Distance: >3 FB Neck ROM: full    Dental no notable dental hx.    Pulmonary neg pulmonary ROS, former smoker   Pulmonary exam normal breath sounds clear to auscultation       Cardiovascular Exercise Tolerance: Good hypertension, negative cardio ROS  Rhythm:regular Rate:Normal     Neuro/Psych   Anxiety     negative neurological ROS  negative psych ROS   GI/Hepatic negative GI ROS, Neg liver ROS,,,  Endo/Other  negative endocrine ROS    Renal/GU negative Renal ROS  negative genitourinary   Musculoskeletal   Abdominal   Peds  Hematology negative hematology ROS (+)   Anesthesia Other Findings   Reproductive/Obstetrics negative OB ROS                             Anesthesia Physical Anesthesia Plan  ASA: 2  Anesthesia Plan: General   Post-op Pain Management:    Induction:   PONV Risk Score and Plan: Propofol infusion  Airway Management Planned:   Additional Equipment:   Intra-op Plan:   Post-operative Plan:   Informed Consent: I have reviewed the patients History and Physical, chart, labs and discussed the procedure including the risks, benefits and alternatives for the proposed anesthesia with the patient or authorized representative who has indicated his/her understanding and acceptance.     Dental Advisory Given  Plan Discussed with: CRNA  Anesthesia Plan Comments:        Anesthesia Quick Evaluation

## 2022-11-06 NOTE — Discharge Instructions (Addendum)
  Colonoscopy Discharge Instructions  Read the instructions outlined below and refer to this sheet in the next few weeks. These discharge instructions provide you with general information on caring for yourself after you leave the hospital. Your doctor may also give you specific instructions. While your treatment has been planned according to the most current medical practices available, unavoidable complications occasionally occur.   ACTIVITY You may resume your regular activity, but move at a slower pace for the next 24 hours.  Take frequent rest periods for the next 24 hours.  Walking will help get rid of the air and reduce the bloated feeling in your belly (abdomen).  No driving for 24 hours (because of the medicine (anesthesia) used during the test).   Do not sign any important legal documents or operate any machinery for 24 hours (because of the anesthesia used during the test).  NUTRITION Drink plenty of fluids.  You may resume your normal diet as instructed by your doctor.  Begin with a light meal and progress to your normal diet. Heavy or fried foods are harder to digest and may make you feel sick to your stomach (nauseated).  Avoid alcoholic beverages for 24 hours or as instructed.  MEDICATIONS You may resume your normal medications unless your doctor tells you otherwise.  WHAT YOU CAN EXPECT TODAY Some feelings of bloating in the abdomen.  Passage of more gas than usual.  Spotting of blood in your stool or on the toilet paper.  IF YOU HAD POLYPS REMOVED DURING THE COLONOSCOPY: No aspirin products for 7 days or as instructed.  No alcohol for 7 days or as instructed.  Eat a soft diet for the next 24 hours.  FINDING OUT THE RESULTS OF YOUR TEST Not all test results are available during your visit. If your test results are not back during the visit, make an appointment with your caregiver to find out the results. Do not assume everything is normal if you have not heard from your  caregiver or the medical facility. It is important for you to follow up on all of your test results.  SEEK IMMEDIATE MEDICAL ATTENTION IF: You have more than a spotting of blood in your stool.  Your belly is swollen (abdominal distention).  You are nauseated or vomiting.  You have a temperature over 101.  You have abdominal pain or discomfort that is severe or gets worse throughout the day.   Your colonoscopy revealed 3 small to medium sized polyp(s) which I removed successfully. Await pathology results, my office will contact you. I recommend repeating colonoscopy in 3 years for surveillance purposes.   Otherwise follow up with GI as needed.   I hope you have a great rest of your week!  Hennie Duos. Marletta Lor, D.O. Gastroenterology and Hepatology [facility]

## 2022-11-06 NOTE — Op Note (Signed)
[facility] Patient Name: Claudia Brewer Procedure Date: 11/06/2022 8:58 AM MRN: [id number] Date of Birth: [birth year] Attending MD: Hennie Duos. Marletta Lor , [place], [id number] CSN: [id number] Age: 57 Admit Type: Outpatient Procedure:                Colonoscopy Indications:              Screening for colorectal malignant neoplasm Providers:                Hennie Duos. Marletta Lor, DO, Angelica Ran, Zena Amos Referring MD:              Medicines:                See the Anesthesia note for documentation of the                            administered medications Complications:            No immediate complications. Estimated Blood Loss:     Estimated blood loss was minimal. Procedure:                Pre-Anesthesia Assessment:                           - The anesthesia plan was to use monitored                            anesthesia care (MAC).                           After obtaining informed consent, the colonoscope                            was passed under direct vision. Throughout the                            procedure, the patient's blood pressure, pulse, and                            oxygen saturations were monitored continuously. The                            [id number]-HQ190L ([id number]) scope was introduced through                            the anus and advanced to the the terminal ileum,                            with identification of the appendiceal orifice and                            IC valve. The colonoscopy was performed without                            difficulty. The patient tolerated the procedure  well. The quality of the bowel preparation was                            evaluated using the BBPS [facility] Bowel Preparation                            Scale) with scores of: Right Colon = 3, Transverse                            Colon = 3 and Left Colon = 3 (entire mucosa seen                            well with no residual staining, small  fragments of                            stool or opaque liquid). The total BBPS score                            equals 9. Scope In: 9:12:00 AM Scope Out: 9:33:03 AM Scope Withdrawal Time: 0 hours 16 minutes 29 seconds  Total Procedure Duration: 0 hours 21 minutes 3 seconds  Findings:      Non-bleeding internal hemorrhoids were found during retroflexion.      Two sessile polyps were found in the transverse colon and ascending       colon. The polyps were 4 to 7 mm in size. These polyps were removed with       a cold snare. Resection and retrieval were complete.      A 13 mm polyp was found in the sigmoid colon. The polyp was       pedunculated. The polyp was removed with a hot snare. Resection and       retrieval were complete.      The terminal ileum appeared normal. Impression:               - Non-bleeding internal hemorrhoids.                           - Two 4 to 7 mm polyps in the transverse colon and                            in the ascending colon, removed with a cold snare.                            Resected and retrieved.                           - One 13 mm polyp in the sigmoid colon, removed                            with a hot snare. Resected and retrieved.                           - The examined portion of the ileum was normal. Moderate Sedation:      Per Anesthesia Care  Recommendation:           - Patient has a contact number available for                            emergencies. The signs and symptoms of potential                            delayed complications were discussed with the                            patient. Return to normal activities tomorrow.                            Written discharge instructions were provided to the                            patient.                           - Resume previous diet.                           - Continue present medications.                           - Await pathology results.                           - Repeat  colonoscopy in 3 years for surveillance.                           - Return to GI clinic PRN. Procedure Code(s):        --- Professional ---                           [phone], Colonoscopy, flexible; with removal of                            tumor(s), polyp(s), or other lesion(s) by snare                            technique Diagnosis Code(s):        --- Professional ---                           Z12.11, Encounter for screening for malignant                            neoplasm of colon                           D12.3, Benign neoplasm of transverse colon (hepatic                            flexure or splenic flexure)  D12.2, Benign neoplasm of ascending colon                           D12.5, Benign neoplasm of sigmoid colon                           K64.8, Other hemorrhoids CPT copyright 2022 American Medical Association. All rights reserved. The codes documented in this report are preliminary and upon coder review may  be revised to meet current compliance requirements. Hennie Duos. Marletta Lor, DO Hennie Duos. Marletta Lor, DO 11/06/2022 9:38:58 AM This report has been signed electronically. Number of Addenda: 0

## 2022-11-06 NOTE — Transfer of Care (Signed)
Immediate Anesthesia Transfer of Care Note  Patient: Claudia Brewer  Procedure(s) Performed: COLONOSCOPY WITH PROPOFOL POLYPECTOMY  Patient Location: Endoscopy Unit  Anesthesia Type:General  Level of Consciousness: drowsy  Airway & Oxygen Therapy: Patient Spontanous Breathing  Post-op Assessment: Report given to RN and Post -op Vital signs reviewed and stable  Post vital signs: Reviewed and stable  Last Vitals:  Vitals Value Taken Time  BP    Temp    Pulse    Resp    SpO2      Last Pain:  Vitals:   11/06/22 0906  TempSrc:   PainSc: 0-No pain      Patients Stated Pain Goal: 6 (11/06/22 0752)  Complications: No notable events documented.

## 2022-11-06 NOTE — H&P (Signed)
Primary Care Physician:  Ignatius Specking, MD Primary Gastroenterologist:  Dr. Marletta Lor  Pre-Procedure History & Physical: HPI:  Claudia Brewer is a 57 y.o. female is here for first ever colonoscopy for colon cancer screening purposes.  Patient denies any family history of colorectal cancer.  No melena or hematochezia.  No abdominal pain or unintentional weight loss.  No change in bowel habits.  Overall feels well from a GI standpoint.  Past Medical History:  Diagnosis Date   Anxiety    Bowel obstruction (HCC)    Essential hypertension     Past Surgical History:  Procedure Laterality Date   BREAST REDUCTION SURGERY  1990    Prior to Admission medications   Medication Sig Start Date End Date Taking? Authorizing Provider  amLODipine (NORVASC) 5 MG tablet Take 5 mg by mouth daily. 06/10/19  Yes [provider]  buPROPion (WELLBUTRIN SR) 150 MG 12 hr tablet Take 150 mg by mouth 2 (two) times daily.   Yes [provider]  Cholecalciferol (VITAMIN D-3) 125 MCG (5000 UT) TABS Take 5,000 Units by mouth daily.   Yes [provider]  guaiFENesin (MUCUS RELIEF PO) Take 1,200 mg by mouth daily as needed (cold).   Yes [provider]  hydrochlorothiazide (HYDRODIURIL) 12.5 MG tablet Take 12.5 mg by mouth daily. 04/06/19  Yes [provider]  Ibuprofen-Acetaminophen (ADVIL DUAL ACTION) 125-250 MG TABS Take 2 tablets by mouth daily as needed (Cold).   Yes [provider]  metFORMIN (GLUCOPHAGE) 500 MG tablet Take 500 mg by mouth 2 (two) times daily with a meal. 05/20/19  Yes [provider]  Multiple Vitamins-Minerals (MULTI-VITAMIN/MINERALS PO) Take 30 mLs by mouth every morning. Grayland Ormond   Yes [provider]  ondansetron (ZOFRAN) 4 MG tablet Take 4 mg by mouth every 8 (eight) hours as needed for nausea or vomiting.   Yes [provider]  OVER THE COUNTER MEDICATION Take 1 tablet by mouth 2 (two) times daily. MaryRuth  [employer] Ultra Digestive Enzymes Capsules   Yes [provider]  oxymetazoline (AFRIN) 0.05 % nasal spray Place 1 spray into both nostrils 2 (two) times daily as needed (Cold).   Yes [provider]  OZEMPIC, 2 MG/DOSE, 8 MG/3ML SOPN Inject 1.5 mg into the skin once a week. 06/11/22  Yes [provider]  rosuvastatin (CRESTOR) 10 MG tablet Take 5 mg by mouth at bedtime. 06/10/19  Yes [provider]    Allergies as of 10/08/2022   (No Known Allergies)    Family History  Problem Relation Age of Onset   CAD Father    Lung cancer Father     Social History   Socioeconomic History   Marital status: Married    Spouse name: Not on file   Number of children: Not on file   Years of education: Not on file   Highest education level: Not on file  Occupational History   Not on file  Tobacco Use   Smoking status: Former    Types: Cigarettes   Smokeless tobacco: Never  Vaping Use   Vaping status: Never Used  Substance and Sexual Activity   Alcohol use: Not Currently    Comment: Occasional   Drug use: Never   Sexual activity: Not on file  Other Topics Concern   Not on file  Social History Narrative   Not on file   Social Determinants of Health   Financial Resource Strain: Not on file  Food Insecurity: Not on  file  Transportation Needs: Not on file  Physical Activity: Not on file  Stress: Not on file  Social Connections: Not on file  Intimate Partner Violence: Not on file    Review of Systems: See HPI, otherwise negative ROS  Physical Exam: Vital signs in last 24 hours: Temp:  [98.3 F (36.8 C)] 98.3 F (36.8 C) (07/23 0752) Pulse Rate:  [73] 73 (07/23 0752) Resp:  [16] 16 (07/23 0752) BP: (134)/(70) 134/70 (07/23 0752) SpO2:  [97 %] 97 % (07/23 0752) Weight:  [73.5 kg] 73.5 kg (07/23 0752)   General:   Alert,  Well-developed, well-nourished, pleasant and cooperative in NAD Head:  Normocephalic and atraumatic. Eyes:  Sclera clear,  no icterus.   Conjunctiva pink. Ears:  Normal auditory acuity. Nose:  No deformity, discharge,  or lesions. Msk:  Symmetrical without gross deformities. Normal posture. Extremities:  Without clubbing or edema. Neurologic:  Alert and  oriented x4;  grossly normal neurologically. Skin:  Intact without significant lesions or rashes. Psych:  Alert and cooperative. Normal mood and affect.  Impression/Plan: Claudia Brewer is here for a colonoscopy to be performed for colon cancer screening purposes.  The risks of the procedure including infection, bleed, or perforation as well as benefits, limitations, alternatives and imponderables have been reviewed with the patient. Questions have been answered. All parties agreeable.

## 2022-11-06 NOTE — Anesthesia Procedure Notes (Signed)
Date/Time: 11/06/2022 9:09 AM  Performed by: Julian Reil, CRNAPre-anesthesia Checklist: Patient identified, Emergency Drugs available, Suction available and Patient being monitored Patient Re-evaluated:Patient Re-evaluated prior to induction Oxygen Delivery Method: Nasal cannula Induction Type: IV induction Placement Confirmation: positive ETCO2

## 2022-11-07 LAB — SURGICAL PATHOLOGY

## 2022-11-08 NOTE — Anesthesia Postprocedure Evaluation (Signed)
Anesthesia Post Note  Patient: Claudia Brewer  Procedure(s) Performed: COLONOSCOPY WITH PROPOFOL POLYPECTOMY  Patient location during evaluation: Phase II Anesthesia Type: General Level of consciousness: awake Pain management: pain level controlled Vital Signs Assessment: post-procedure vital signs reviewed and stable Respiratory status: spontaneous breathing and respiratory function stable Cardiovascular status: blood pressure returned to baseline and stable Postop Assessment: no headache and no apparent nausea or vomiting Anesthetic complications: no Comments: Late entry   No notable events documented.   Last Vitals:  Vitals:   11/06/22 0935 11/06/22 0939  BP: (!) 85/37 92/62  Pulse: 77 76  Resp: 17 16  Temp: 36.7 C   SpO2: 95% 97%    Last Pain:  Vitals:   11/06/22 0935  TempSrc: Oral  PainSc: 0-No pain                 Windell Norfolk

## 2022-11-12 ENCOUNTER — Encounter (HOSPITAL_COMMUNITY): Payer: Self-pay | Admitting: Internal Medicine

## 2023-02-04 ENCOUNTER — Inpatient Hospital Stay
Admission: RE | Admit: 2023-02-04 | Discharge: 2023-02-04 | Disposition: A | Discharge status: Home / Self Care | Payer: BC Managed Care – PPO | Source: Ambulatory Visit | Attending: Internal Medicine | Admitting: Internal Medicine

## 2023-02-04 DIAGNOSIS — Z1231 Encounter for screening mammogram for malignant neoplasm of breast: Secondary | ICD-10-CM

## 2023-02-06 IMAGING — MG MM DIGITAL SCREENING BILAT W/ TOMO AND CAD
8 series · 9 of 24 positions shown · non-contrast
Comparison: None.

CLINICAL DATA: Screening.

EXAM:
DIGITAL SCREENING BILATERAL MAMMOGRAM WITH TOMOSYNTHESIS AND CAD
TECHNIQUE: Bilateral screening digital craniocaudal and mediolateral oblique
mammograms were obtained. Bilateral screening digital breast
tomosynthesis was performed. The images were evaluated with
computer-aided detection.

[L CC synth-2D]
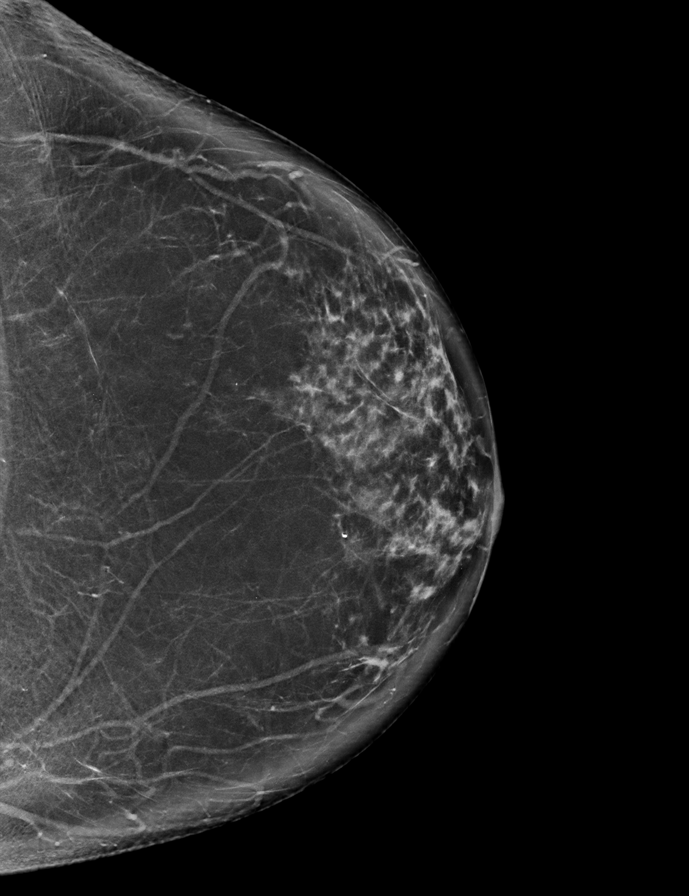

[R MLO synth-2D]
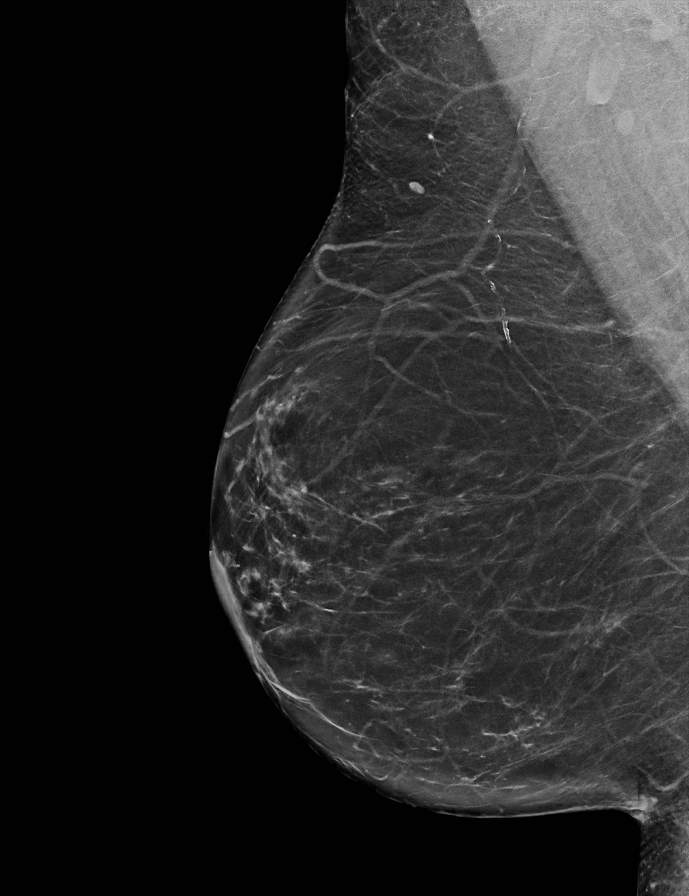

[R CC synth-2D]
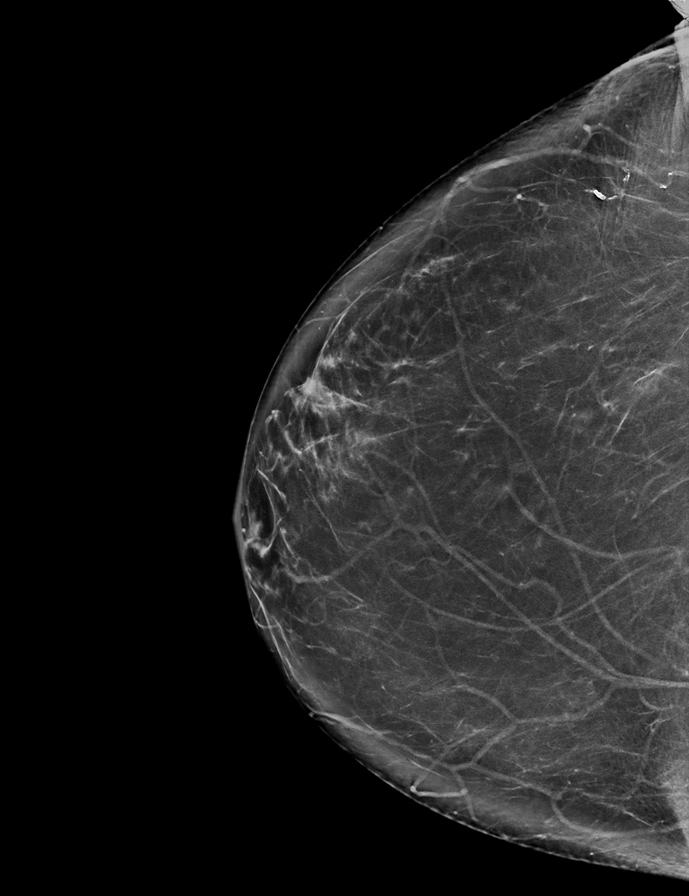

[L MLO synth-2D]
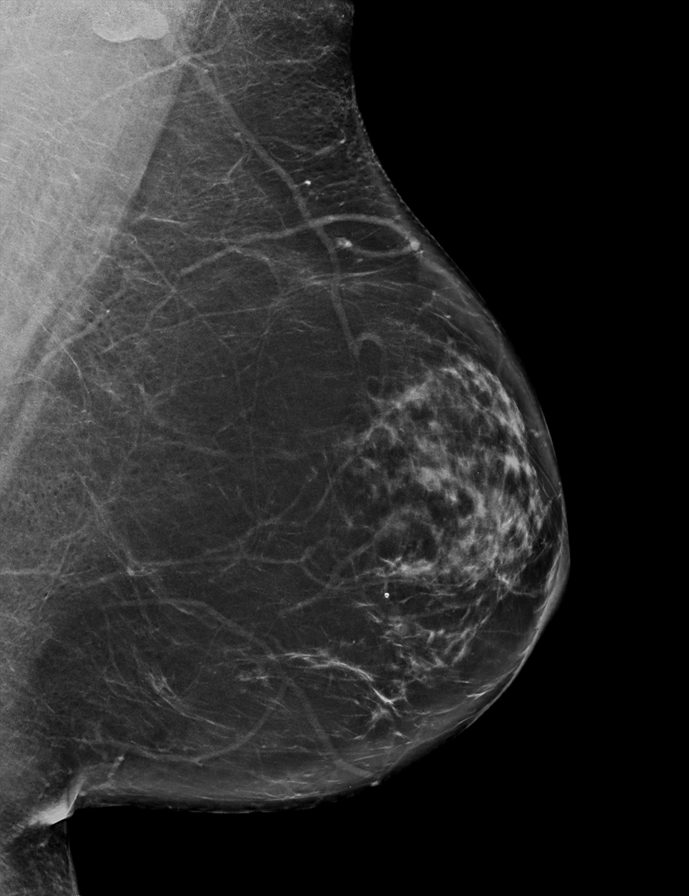

[R CC tomo · 2 of 78 frames shown]
[frame 26/78]
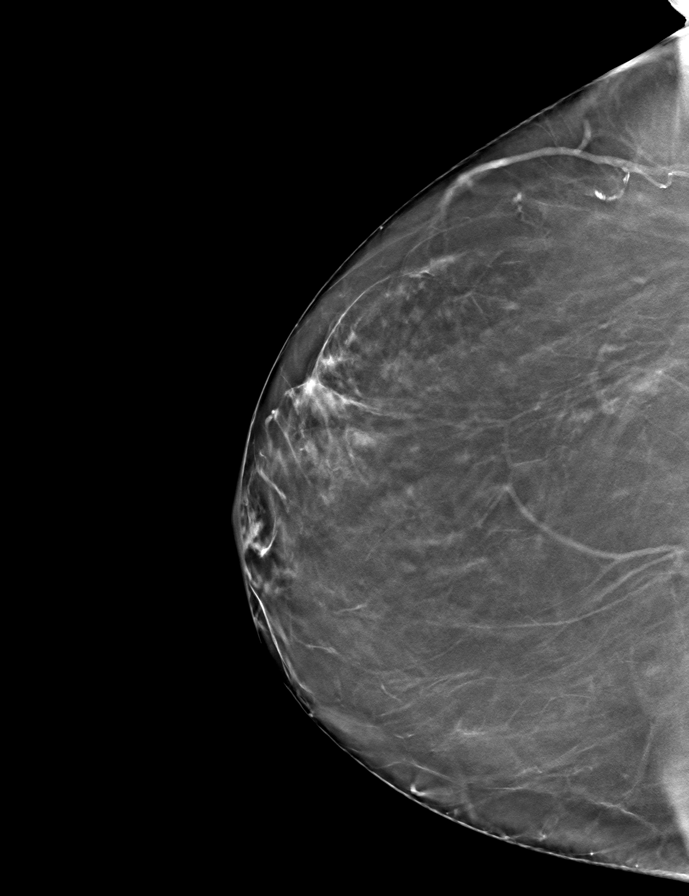
[frame 39/78]
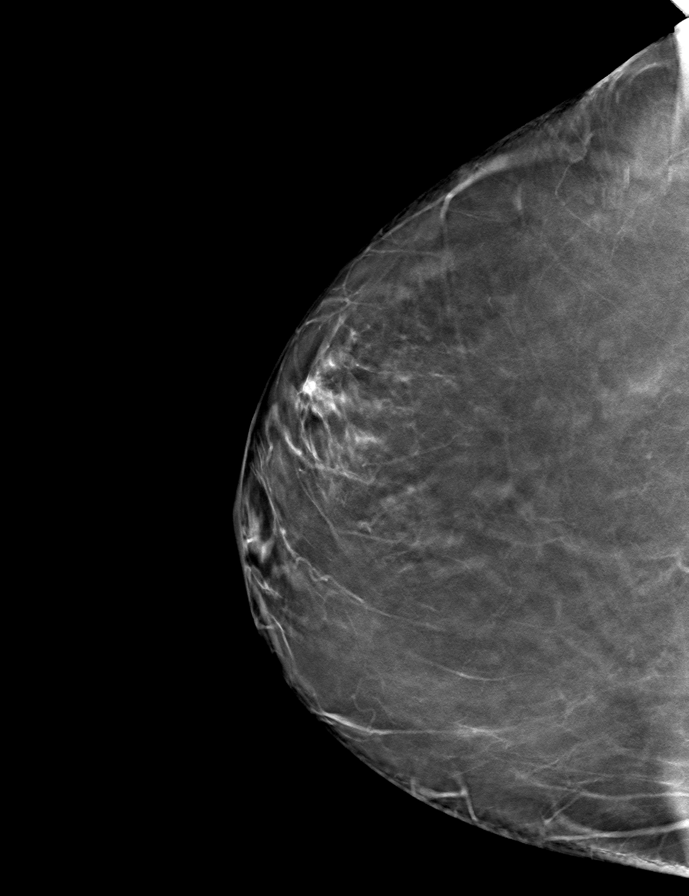

[L MLO tomo · tomo slice 39/76.0]
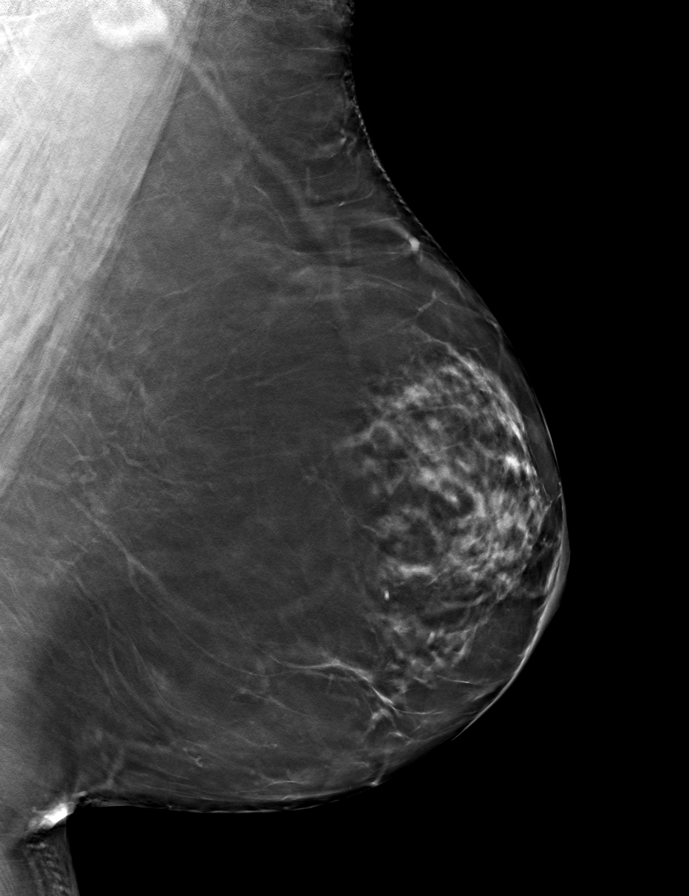

[R MLO tomo · tomo slice 37/74.0]
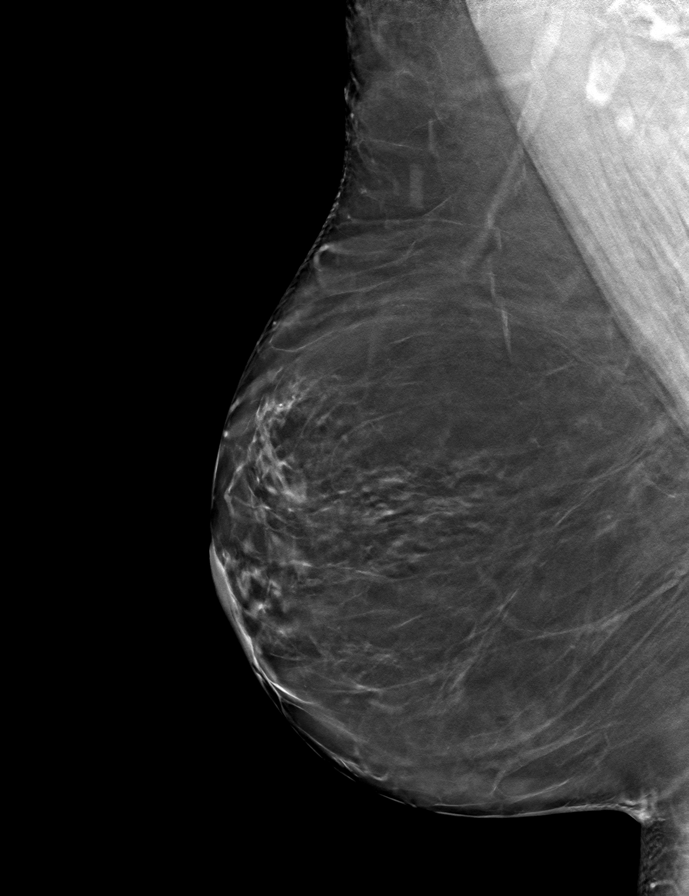

[L CC tomo · tomo slice 39/78.0]
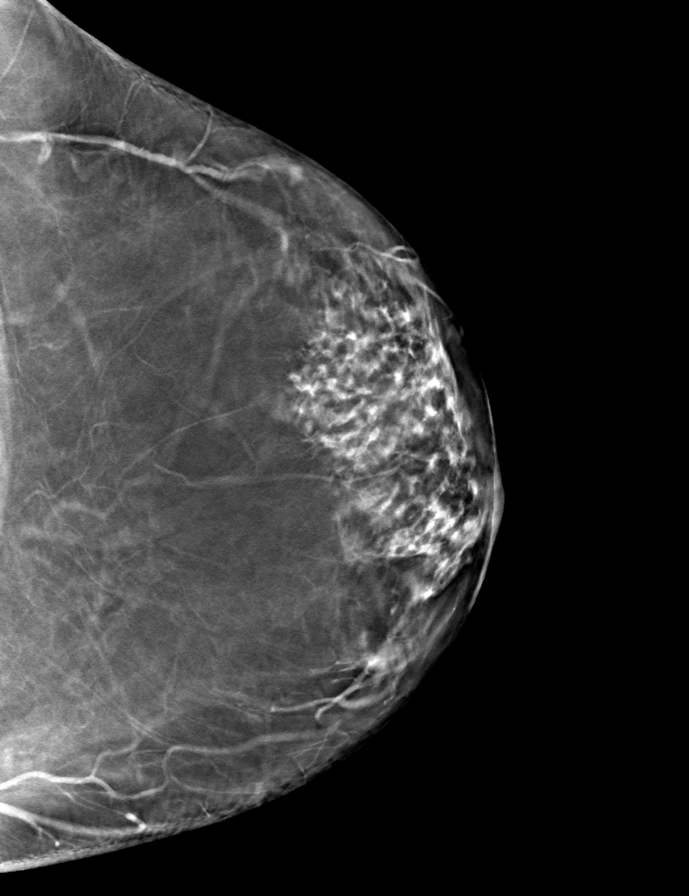

[9 of 24 positions shown; findings below may reference images not displayed]

ACR Breast Density Category b: There are scattered areas of
fibroglandular density.
FINDINGS: There are no findings suspicious for malignancy.
IMPRESSION: No mammographic evidence of malignancy. A result letter of this
screening mammogram will be mailed directly to the patient.

RECOMMENDATION:
Screening mammogram in one year. (Code:XG-X-X7B)

BI-RADS CATEGORY  1: Negative.

## 2023-12-30 ENCOUNTER — Other Ambulatory Visit: Payer: Self-pay | Admitting: Internal Medicine

## 2023-12-30 DIAGNOSIS — Z1231 Encounter for screening mammogram for malignant neoplasm of breast: Secondary | ICD-10-CM

## 2024-02-05 ENCOUNTER — Ambulatory Visit

## 2024-02-19 ENCOUNTER — Encounter (INDEPENDENT_AMBULATORY_CARE_PROVIDER_SITE_OTHER): Payer: Self-pay | Admitting: Gastroenterology

## 2024-03-04 ENCOUNTER — Ambulatory Visit: Payer: Self-pay
